# Patient Record
Sex: Male | Born: 1965
Health system: Southern US, Community
[De-identification: ages and names within clinical notes are randomized; demographics above are authoritative.]

## PROBLEM LIST (undated history)

## (undated) DIAGNOSIS — I10 Essential (primary) hypertension: Secondary | ICD-10-CM

## (undated) DIAGNOSIS — E7800 Pure hypercholesterolemia, unspecified: Secondary | ICD-10-CM

## (undated) DIAGNOSIS — N200 Calculus of kidney: Secondary | ICD-10-CM

## (undated) HISTORY — PX: HERNIA REPAIR: SHX51

## (undated) HISTORY — PX: KNEE SURGERY: SHX244

---

## 2013-01-17 ENCOUNTER — Emergency Department: Payer: Self-pay | Admitting: Emergency Medicine

## 2013-01-17 LAB — CBC
HCT: 44.8 % (ref 40.0–52.0)
HGB: 15.7 g/dL (ref 13.0–18.0)
MCV: 91 fL (ref 80–100)
Platelet: 188 10*3/uL (ref 150–440)
RDW: 13 % (ref 11.5–14.5)
WBC: 8.4 10*3/uL (ref 3.8–10.6)

## 2013-01-17 LAB — URINALYSIS, COMPLETE
Bilirubin,UR: NEGATIVE
Hyaline Cast: 1
Leukocyte Esterase: NEGATIVE
Nitrite: NEGATIVE
Ph: 5 (ref 4.5–8.0)
Protein: NEGATIVE
RBC,UR: 73 /HPF (ref 0–5)
Specific Gravity: 1.03 (ref 1.003–1.030)

## 2013-01-17 LAB — BASIC METABOLIC PANEL
Anion Gap: 10 (ref 7–16)
BUN: 20 mg/dL — ABNORMAL HIGH (ref 7–18)
Chloride: 106 mmol/L (ref 98–107)
Co2: 24 mmol/L (ref 21–32)
Creatinine: 1.23 mg/dL (ref 0.60–1.30)
EGFR (Non-African Amer.): >60
Glucose: 167 mg/dL — ABNORMAL HIGH (ref 65–99)
Osmolality: 286 (ref 275–301)
Potassium: 3.9 mmol/L (ref 3.5–5.1)
Sodium: 140 mmol/L (ref 136–145)

## 2013-10-16 ENCOUNTER — Ambulatory Visit: Payer: Self-pay | Admitting: Urology

## 2015-12-23 DIAGNOSIS — E782 Mixed hyperlipidemia: Secondary | ICD-10-CM | POA: Diagnosis not present

## 2015-12-23 DIAGNOSIS — I10 Essential (primary) hypertension: Secondary | ICD-10-CM | POA: Diagnosis not present

## 2015-12-23 DIAGNOSIS — N200 Calculus of kidney: Secondary | ICD-10-CM | POA: Diagnosis not present

## 2015-12-23 DIAGNOSIS — Z6836 Body mass index (BMI) 36.0-36.9, adult: Secondary | ICD-10-CM | POA: Diagnosis not present

## 2016-07-24 DIAGNOSIS — Z0000 Encounter for general adult medical examination without abnormal findings: Secondary | ICD-10-CM | POA: Diagnosis not present

## 2016-07-24 DIAGNOSIS — Z6837 Body mass index (BMI) 37.0-37.9, adult: Secondary | ICD-10-CM | POA: Diagnosis not present

## 2016-07-24 DIAGNOSIS — I10 Essential (primary) hypertension: Secondary | ICD-10-CM | POA: Diagnosis not present

## 2016-07-24 DIAGNOSIS — E782 Mixed hyperlipidemia: Secondary | ICD-10-CM | POA: Diagnosis not present

## 2016-08-15 ENCOUNTER — Emergency Department
Admission: EM | Admit: 2016-08-15 | Discharge: 2016-08-15 | Disposition: A | Discharge status: Home / Self Care | Payer: BLUE CROSS/BLUE SHIELD | Attending: Student in an Organized Health Care Education/Training Program | Admitting: Student in an Organized Health Care Education/Training Program

## 2016-08-15 ENCOUNTER — Emergency Department: Payer: BLUE CROSS/BLUE SHIELD

## 2016-08-15 ENCOUNTER — Encounter: Payer: Self-pay | Admitting: Emergency Medicine

## 2016-08-15 DIAGNOSIS — I10 Essential (primary) hypertension: Secondary | ICD-10-CM | POA: Insufficient documentation

## 2016-08-15 DIAGNOSIS — M25562 Pain in left knee: Secondary | ICD-10-CM

## 2016-08-15 DIAGNOSIS — M25462 Effusion, left knee: Secondary | ICD-10-CM

## 2016-08-15 HISTORY — DX: Calculus of kidney: N20.0

## 2016-08-15 HISTORY — DX: Pure hypercholesterolemia, unspecified: E78.00

## 2016-08-15 HISTORY — DX: Essential (primary) hypertension: I10

## 2016-08-15 LAB — SYNOVIAL CELL COUNT + DIFF, W/ CRYSTALS
CRYSTALS FLUID: NONE SEEN
Eosinophils-Synovial: 0 %
LYMPHOCYTES-SYNOVIAL FLD: 26 %
MONOCYTE-MACROPHAGE-SYNOVIAL FLUID: 73 %
Neutrophil, Synovial: 1 %
WBC, Synovial: 889 /mm3 — ABNORMAL HIGH (ref 0–200)

## 2016-08-15 MED ORDER — LIDOCAINE HCL (PF) 1 % IJ SOLN
30.0000 mL | Freq: Once | INTRAMUSCULAR | Status: AC
  Administered 2016-08-15: 30 mL via INTRADERMAL
  Filled 2016-08-15: qty 30

## 2016-08-15 MED ORDER — HYDROCODONE-ACETAMINOPHEN 5-325 MG PO TABS: 1.0000 | ORAL_TABLET | ORAL | 0 refills | Status: DC | PRN

## 2016-08-15 MED ORDER — NAPROXEN 500 MG PO TABS: 500.0000 mg | ORAL_TABLET | Freq: Two times a day (BID) | ORAL | 0 refills | Status: AC

## 2016-08-15 NOTE — ED Notes (Signed)
Ace wrap applied to left knee. Pt tolerated well

## 2016-08-15 NOTE — ED Notes (Signed)
Pt to ed with c/o left knee pain and swelling x 3 weeks.  Pt denies injury.  No redness noted to knee.  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  Good ROM but with increased pain.  Pt alert and oriented.  Family at bedside.

## 2016-08-15 NOTE — ED Provider Notes (Signed)
[facility] Emergency Department Provider Note    None    (approximate)  I have reviewed the triage vital signs and the nursing notes.   HISTORY  Chief Complaint Knee Injury    HPI Melvin Murray is a 51 y.o. male this visit 3 weeks of progressively worsening left knee pain and swelling. Denies any focal injury the patient works in [employer] and states that he is on his knees using kneepads were working with concrete all day. States that over the past several days the range of motion has decreased due to worsening swelling. He denies any fevers. Does not know any history of gout. No tick bites. No previous history of arthritides.  He is not on any blood thinners.   Past Medical History:  Diagnosis Date  . Hypercholesterolemia   . Hypertension   . Kidney stones    No family history on file. Past Surgical History:  Procedure Laterality Date  . HERNIA REPAIR    . KNEE SURGERY     right   There are no active problems to display for this patient.     Prior to Admission medications   Not on File    Allergies Patient has no known allergies.    Social History Social History  Substance Use Topics  . Smoking status: Never Smoker  . Smokeless tobacco: Never Used  . Alcohol use Not on file    Review of Systems Patient denies headaches, rhinorrhea, blurry vision, numbness, shortness of breath, chest pain, edema, cough, abdominal pain, nausea, vomiting, diarrhea, dysuria, fevers, rashes or hallucinations unless otherwise stated above in HPI. ____________________________________________   PHYSICAL EXAM:  VITAL SIGNS: Vitals:   08/15/16 0906  BP: 128/66  Pulse: 62  Resp: 16  Temp: 98 F (36.7 C)    Constitutional: Alert and oriented. Well appearing and in no acute distress. Eyes: Conjunctivae are normal. PERRL. EOMI. Head: Atraumatic. Nose: No congestion/rhinnorhea. Mouth/Throat: Mucous membranes are moist.  Oropharynx  non-erythematous. Neck: No stridor. Painless ROM. No cervical spine tenderness to palpation Hematological/Lymphatic/Immunilogical: No cervical lymphadenopathy. Cardiovascular: Normal rate, regular rhythm. Grossly normal heart sounds.  Good peripheral circulation. Respiratory: Normal respiratory effort.  No retractions. Lungs CTAB. Gastrointestinal: Soft and nontender. No distention. No abdominal bruits. No CVA tenderness. Genitourinary:  Musculoskeletal: Left knee does have a large tense effusion limiting range of motion. He does have painless passive and active range of motion of roughly 30. There is no significant crepitus. The patella does have some bogginess to it. There is no overlying erythema. Neurologic:  Normal speech and language. No gross focal neurologic deficits are appreciated. No gait instability. Skin:  Skin is warm, dry and intact. No rash noted. Psychiatric: Mood and affect are normal. Speech and behavior are normal.  ____________________________________________   LABS (all labs ordered are listed, but only abnormal results are displayed)  No results found for this or any previous visit (from the past 24 hour(s)). ____________________________________________  EKG ____________________________________________  RADIOLOGY  I personally reviewed all radiographic images ordered to evaluate for the above acute complaints and reviewed radiology reports and findings.  These findings were personally discussed with the patient.  Please see medical record for radiology report.  ____________________________________________   PROCEDURES  Procedure(s) performed:  .Joint Aspiration/Arthrocentesis Date/Time: 08/15/2016 10:13 AM Performed by: Merlyn Lot Authorized by: Merlyn Lot   Consent:    Consent obtained:  Verbal   Consent given by:  Patient   Risks discussed:  Bleeding, incomplete drainage, infection, nerve damage,  pain and poor cosmetic result    Alternatives discussed:  No treatment Location:    Location:  Knee   Knee:  L knee Anesthesia (see MAR for exact dosages):    Anesthesia method:  Local infiltration   Local anesthetic:  Lidocaine 1% w/o epi Procedure details:    Preparation: Patient was prepped and draped in usual sterile fashion     Needle gauge:  18 G   Approach:  Lateral   Aspirate amount:  30   Aspirate characteristics:  Serous   Steroid injected: no     Specimen collected: yes   Post-procedure details:    Dressing:  Adhesive bandage and gauze roll   Patient tolerance of procedure:  Tolerated well, no immediate complications      Critical Care performed: no ____________________________________________   INITIAL IMPRESSION / ASSESSMENT AND PLAN / ED COURSE  Pertinent labs & imaging results that were available during my care of the patient were reviewed by me and considered in my medical decision making (see chart for details).  DDX: Gout, septic arthritis, traumatic arthritis, fracture, inflammatory arthritis, bursitis  Melvin Murray is a 51 y.o. who presents to the ED with left knee pain and swelling. Denies any other injuries. Denies motor or sensory loss. Able to bear weight. VSS in ED. Exam as above. NV intact throughout and distal to injury. Pt able to range joint but with limited ROM due to tense effusion. No ligament laxity on exam. No clinical suspicion for infectious process or septic joint. X-rays w/o fracture. No other injuries reported or noted on exam.  Based on tense effusion I do plan on per warming arthrocentesis to further evaluate.   Clinical Course as of Aug 15 1257  Sat Aug 15, 2016  1054 Went up date patient. Laboratories also pending. Patient requesting discharge home. As there is a very low probability the patient having an acute infected knee and feel his presentation more consistent with inflammatory arthritis I do feel is reasonable for patient to be discharged. He has provided  reliable contact information. I will follow-up on lab test and will call him back should any other be abnormal. Otherwise if there is no evidence of infectious or gout process we'll continue with conservative management with follow-up with orthopedics. Patient was able to tolerate PO and was able to ambulate with a steady gait.  Have discussed with the patient and available family all diagnostics and treatments performed thus far and all questions were answered to the best of my ability. The patient demonstrates understanding and agreement with plan.    [PR]    Clinical Course User Index [PR] Merlyn Lot, MD     ____________________________________________   FINAL CLINICAL IMPRESSION(S) / ED DIAGNOSES  Final diagnoses:  Acute pain of left knee  Effusion of left knee      NEW MEDICATIONS STARTED DURING THIS VISIT:  New Prescriptions   No medications on file     Note:  This document was prepared using Dragon voice recognition software and may include unintentional dictation errors.    Merlyn Lot, MD 08/15/16 1300

## 2016-08-15 NOTE — ED Triage Notes (Signed)
Reports his left knee gave out about 3 weeks ago.  Pain since.

## 2016-08-17 LAB — MISC LABCORP TEST (SEND OUT)
LABCORP TEST NAME: 19497
LABCORP TEST NAME: 19588
Labcorp test code: 19497
Labcorp test code: 19588

## 2016-08-17 LAB — GRAM STAIN

## 2016-08-20 LAB — CULTURE, BODY FLUID W GRAM STAIN -BOTTLE: Culture: NO GROWTH

## 2016-08-20 LAB — CULTURE, BODY FLUID-BOTTLE

## 2016-08-26 DIAGNOSIS — M25462 Effusion, left knee: Secondary | ICD-10-CM | POA: Diagnosis not present

## 2016-08-26 DIAGNOSIS — M2392 Unspecified internal derangement of left knee: Secondary | ICD-10-CM | POA: Diagnosis not present

## 2016-09-16 DIAGNOSIS — M2392 Unspecified internal derangement of left knee: Secondary | ICD-10-CM | POA: Diagnosis not present

## 2017-01-28 DIAGNOSIS — E669 Obesity, unspecified: Secondary | ICD-10-CM | POA: Diagnosis not present

## 2017-01-28 DIAGNOSIS — Z1389 Encounter for screening for other disorder: Secondary | ICD-10-CM | POA: Diagnosis not present

## 2017-01-28 DIAGNOSIS — I10 Essential (primary) hypertension: Secondary | ICD-10-CM | POA: Diagnosis not present

## 2017-01-28 DIAGNOSIS — R7303 Prediabetes: Secondary | ICD-10-CM | POA: Diagnosis not present

## 2017-01-28 DIAGNOSIS — E782 Mixed hyperlipidemia: Secondary | ICD-10-CM | POA: Diagnosis not present

## 2017-01-28 DIAGNOSIS — N200 Calculus of kidney: Secondary | ICD-10-CM | POA: Diagnosis not present

## 2017-08-02 DIAGNOSIS — I10 Essential (primary) hypertension: Secondary | ICD-10-CM | POA: Diagnosis not present

## 2017-08-02 DIAGNOSIS — R7303 Prediabetes: Secondary | ICD-10-CM | POA: Diagnosis not present

## 2017-08-02 DIAGNOSIS — Z0000 Encounter for general adult medical examination without abnormal findings: Secondary | ICD-10-CM | POA: Diagnosis not present

## 2017-08-02 DIAGNOSIS — E782 Mixed hyperlipidemia: Secondary | ICD-10-CM | POA: Diagnosis not present

## 2017-08-02 DIAGNOSIS — R1031 Right lower quadrant pain: Secondary | ICD-10-CM | POA: Diagnosis not present

## 2017-08-08 IMAGING — DX DG KNEE 1-2V*L*
2 series · 2 of 2 positions shown · non-contrast
Comparison: None.

CLINICAL DATA: Pt states he has had pain and swelling to LT knee x
2-3 weeks with no known injury; pain with weight-bearing; no
previous hx per pt.

EXAM:
LEFT KNEE - 1-2 VIEW

[knee ap]
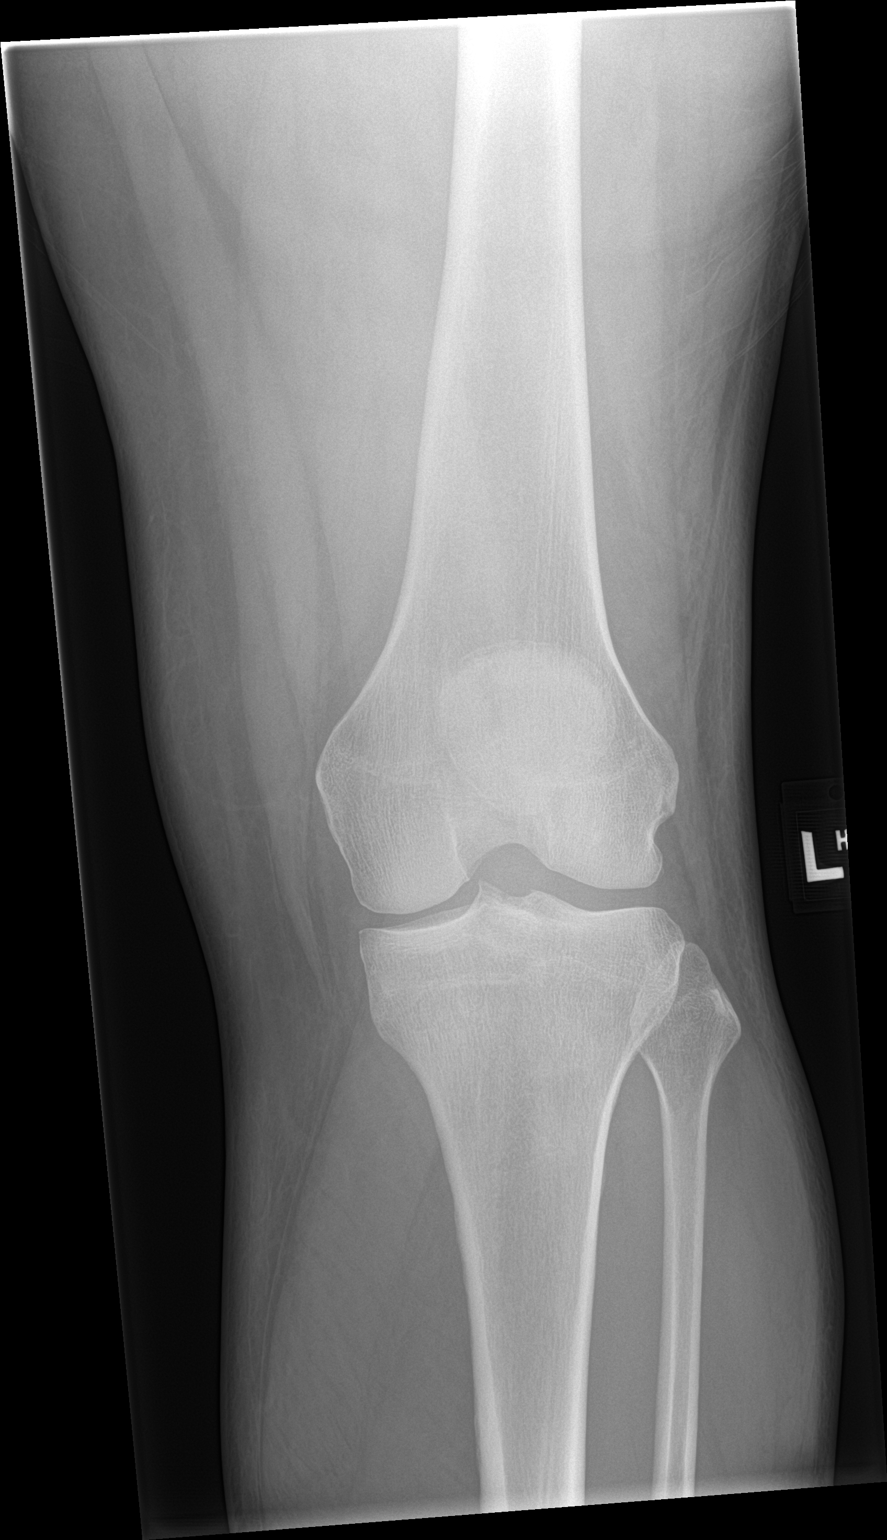

[knee lat]
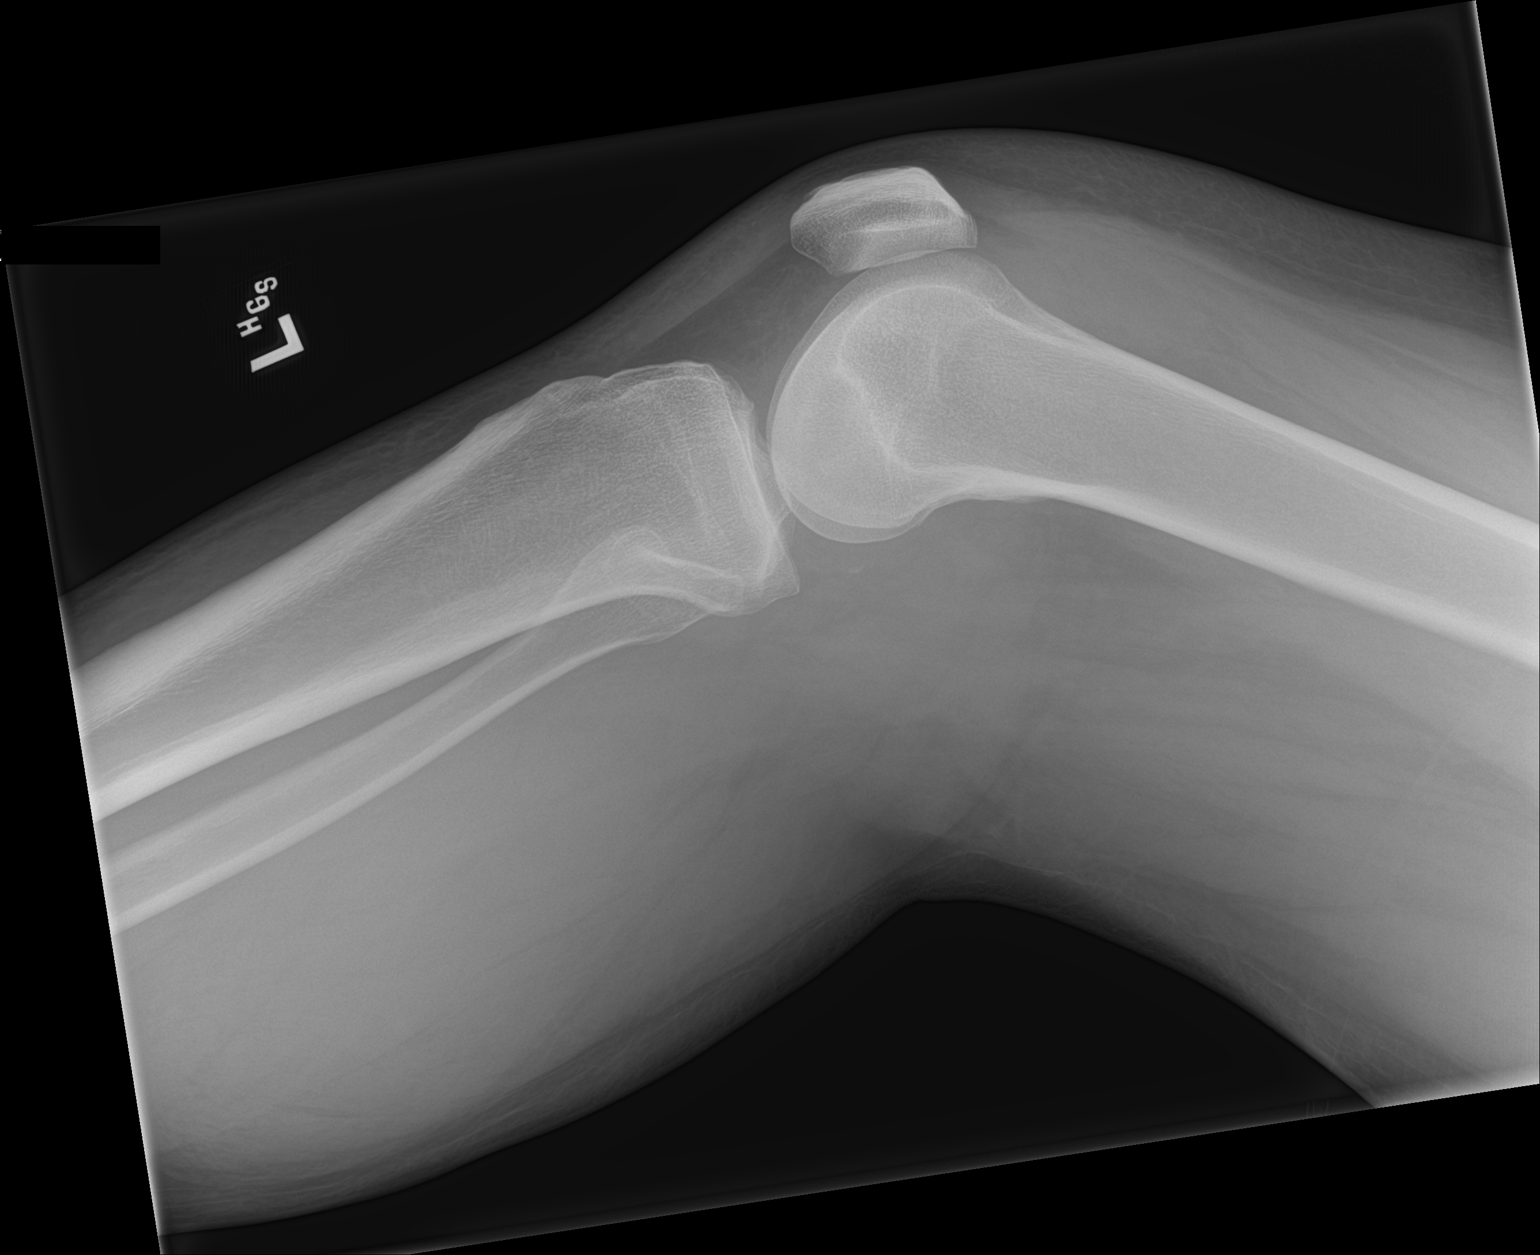

[2 of 2 positions shown; findings below may reference images not displayed]

FINDINGS: No fracture.  No bone lesion.

Knee joint is normally spaced and aligned. No significant
degenerative/ arthropathic changes.

There is a small to moderate joint effusion distending the
suprapatellar joint capsule.

Soft tissues are otherwise unremarkable.
IMPRESSION: 1. No fracture, bone lesion or arthropathic changes.
2. Small to moderate joint effusion.

## 2017-08-12 ENCOUNTER — Encounter: Payer: Self-pay | Admitting: *Deleted

## 2017-08-18 ENCOUNTER — Encounter: Payer: Self-pay | Admitting: *Deleted

## 2017-08-19 ENCOUNTER — Ambulatory Visit: Payer: Self-pay | Admitting: General Surgery

## 2017-09-09 ENCOUNTER — Encounter: Payer: Self-pay | Admitting: General Surgery

## 2017-09-09 ENCOUNTER — Ambulatory Visit: Payer: BLUE CROSS/BLUE SHIELD | Admitting: General Surgery

## 2017-09-09 VITALS — BP 130/70 | HR 71 | Resp 12 | Ht 67.0 in | Wt 254.0 lb

## 2017-09-09 DIAGNOSIS — Z1211 Encounter for screening for malignant neoplasm of colon: Secondary | ICD-10-CM | POA: Diagnosis not present

## 2017-09-09 DIAGNOSIS — K5901 Slow transit constipation: Secondary | ICD-10-CM | POA: Diagnosis not present

## 2017-09-09 MED ORDER — POLYETHYLENE GLYCOL 3350 17 GM/SCOOP PO POWD: 1.0000 | Freq: Once | ORAL | 0 refills | Status: AC

## 2017-09-09 MED ORDER — BISACODYL 5 MG PO TBEC: 5.0000 mg | DELAYED_RELEASE_TABLET | ORAL | 0 refills | Status: AC | PRN

## 2017-09-09 NOTE — Patient Instructions (Addendum)
2 day colonoscopy prep  Colonoscopy, Adult A colonoscopy is an exam to look at the entire large intestine. During the exam, a lubricated, bendable tube is inserted into the anus and then passed into the rectum, colon, and other parts of the large intestine. A colonoscopy is often done as a part of normal colorectal screening or in response to certain symptoms, such as anemia, persistent diarrhea, abdominal pain, and blood in the stool. The exam can help screen for and diagnose medical problems, including:  Tumors.  Polyps.  Inflammation.  Areas of bleeding.  Tell a health care provider about:  Any allergies you have.  All medicines you are taking, including vitamins, herbs, eye drops, creams, and over-the-counter medicines.  Any problems you or family members have had with anesthetic medicines.  Any blood disorders you have.  Any surgeries you have had.  Any medical conditions you have.  Any problems you have had passing stool. What are the risks? Generally, this is a safe procedure. However, problems may occur, including:  Bleeding.  A tear in the intestine.  A reaction to medicines given during the exam.  Infection (rare).  What happens before the procedure? Eating and drinking restrictions Follow instructions from your health care provider about eating and drinking, which may include:  A few days before the procedure - follow a low-fiber diet. Avoid nuts, seeds, dried fruit, raw fruits, and vegetables.  1-3 days before the procedure - follow a clear liquid diet. Drink only clear liquids, such as clear broth or bouillon, black coffee or tea, clear juice, clear soft drinks or sports drinks, gelatin dessert, and popsicles. Avoid any liquids that contain red or purple dye.  On the day of the procedure - do not eat or drink anything during the 2 hours before the procedure, or within the time period that your health care provider recommends.  Bowel prep If you were  prescribed an oral bowel prep to clean out your colon:  Take it as told by your health care provider. Starting the day before your procedure, you will need to drink a large amount of medicated liquid. The liquid will cause you to have multiple loose stools until your stool is almost clear or light green.  If your skin or anus gets irritated from diarrhea, you may use these to relieve the irritation: ? Medicated wipes, such as adult wet wipes with aloe and vitamin E. ? A skin soothing-product like petroleum jelly.  If you vomit while drinking the bowel prep, take a break for up to 60 minutes and then begin the bowel prep again. If vomiting continues and you cannot take the bowel prep without vomiting, call your health care provider.  General instructions  Ask your health care provider about changing or stopping your regular medicines. This is especially important if you are taking diabetes medicines or blood thinners.  Plan to have someone take you home from the hospital or clinic. What happens during the procedure?  An IV tube may be inserted into one of your veins.  You will be given medicine to help you relax (sedative).  To reduce your risk of infection: ? Your health care team will wash or sanitize their hands. ? Your anal area will be washed with soap.  You will be asked to lie on your side with your knees bent.  Your health care provider will lubricate a long, thin, flexible tube. The tube will have a camera and a light on the end.  The tube  will be inserted into your anus.  The tube will be gently eased through your rectum and colon.  Air will be delivered into your colon to keep it open. You may feel some pressure or cramping.  The camera will be used to take images during the procedure.  A small tissue sample may be removed from your body to be examined under a microscope (biopsy). If any potential problems are found, the tissue will be sent to a lab for testing.  If  small polyps are found, your health care provider may remove them and have them checked for cancer cells.  The tube that was inserted into your anus will be slowly removed. The procedure may vary among health care providers and hospitals. What happens after the procedure?  Your blood pressure, heart rate, breathing rate, and blood oxygen level will be monitored until the medicines you were given have worn off.  Do not drive for 24 hours after the exam.  You may have a small amount of blood in your stool.  You may pass gas and have mild abdominal cramping or bloating due to the air that was used to inflate your colon during the exam.  It is up to you to get the results of your procedure. Ask your health care provider, or the department performing the procedure, when your results will be ready. This information is not intended to replace advice given to you by your health care provider. Make sure you discuss any questions you have with your health care provider. Document Released: 07/03/2000 Document Revised: 05/06/2016 Document Reviewed: 09/17/2015 Elsevier Interactive Patient Education  [employer].  The patient is scheduled for a Colonoscopy at [facility] on 10/06/17. They are aware to call the day before to get their arrival time. He will complete a two day clear liquid prep. He will take 2-5 mg Dulcolax tablets the morning and evening two days prior and 2-5 mg Dulcolax tablets the morning prior to procedure. He will decrease his Aspirin to 81 mg one week prior and stop his Fish Oil one week prior. Miralax prescription has been sent into the patient's pharmacy. The patient is aware of date and instructions.

## 2017-09-09 NOTE — Progress Notes (Addendum)
Patient ID: Melvin Murray, male   DOB: 1965/11/28, 52 y.o.   MRN: [id number]  Chief Complaint  Patient presents with  . Colonoscopy    HPI Melvin Murray is a 52 y.o. male.  Who presents for a colonoscopy discussion, none prior. Denies any gastrointestinal issues. Bowels move every 3 days, occasional pellets but no bleeding noted. The patient drinks a good amount of free water, but works in the [employer] which is quite hot during the summer months.  He is here with his wife, Melvin Murray of 18 years.  HPI  Past Medical History:  Diagnosis Date  . Hypercholesterolemia   . Hypertension   . Kidney stones     Past Surgical History:  Procedure Laterality Date  . HERNIA REPAIR    . KNEE SURGERY     right    Family History  Problem Relation Age of Onset  . Colon cancer Neg Hx     Social History Social History   Tobacco Use  . Smoking status: Never Smoker  . Smokeless tobacco: Never Used  Substance Use Topics  . Alcohol use: No    Frequency: Never  . Drug use: No    No Known Allergies  Current Outpatient Medications  Medication Sig Dispense Refill  . aspirin EC 325 MG tablet Take 325 mg by mouth daily.     Marland Kitchen lisinopril (PRINIVIL,ZESTRIL) 20 MG tablet Take 20 mg by mouth daily.     . Omega-3 Fatty Acids (FISH OIL) 1000 MG CAPS Take by mouth daily.    . potassium citrate (UROCIT-K) 10 MEQ (1080 MG) SR tablet Take 10 mEq by mouth 2 (two) times daily.     . pravastatin (PRAVACHOL) 40 MG tablet Take 40 mg by mouth daily.     . bisacodyl (DULCOLAX) 5 MG EC tablet Take 1 tablet (5 mg total) by mouth as needed for moderate constipation. Take 2 tablets the morning and evening 2 days prior to your colonoscopy Take 2 tablets the afternoon prior to your colonoscopy 6 tablet 0  . polyethylene glycol powder (GLYCOLAX/MIRALAX) powder Take 255 g by mouth once for 1 dose. Mix whole container with 64 ounces of clear liquids 255 g 0   No current facility-administered  medications for this visit.     Review of Systems Review of Systems  Constitutional: Negative.   Respiratory: Negative.   Cardiovascular: Negative.   Gastrointestinal: Positive for constipation. Negative for blood in stool and diarrhea.    Blood pressure 130/70, pulse 71, resp. rate 12, height 5\' 7"  (1.702 m), weight 254 lb (115.2 kg).  Physical Exam Physical Exam  Constitutional: He is oriented to person, place, and time. He appears well-developed and well-nourished.  HENT:  Mouth/Throat: Oropharynx is clear and moist.  Eyes: Conjunctivae are normal. No scleral icterus.  Neck: Neck supple.  Cardiovascular: Normal rate and regular rhythm.  Pulmonary/Chest: Effort normal and breath sounds normal.  Lymphadenopathy:    He has no cervical adenopathy.  Neurological: He is alert and oriented to person, place, and time.  Skin: Skin is warm and dry.  Psychiatric: His behavior is normal.    Data Reviewed Laboratory studies associated with the August 02, 2017 office note from [place], [place] show a creatinine of 0.9, normal electrolytes, normal liver function studies, hemoglobin 16.4 with an MCV of 91, platelet count of 181,000, hemoglobin A1c: 5.8.  Assessment    Candidate for screening colonoscopy.    Plan    Colonoscopy with possible biopsy/polypectomy prn: Information  regarding the procedure, including its potential risks and complications (including but not limited to perforation of the bowel, which may require emergency surgery to repair, and bleeding) was verbally given to the patient. Educational information regarding lower intestinal endoscopy was given to the patient. Written instructions for how to complete the bowel prep using Miralax were provided. The importance of drinking ample fluids to avoid dehydration as a result of the prep emphasized.  2 day colonoscopy prep with Dulcolax will be planned based on his history of infrequent bowel movements every 3+ days and hard  stools.    HPI, Physical Exam, Assessment and Plan have been scribed under the direction and in the presence of Robert Bellow, MD. Karie Fetch, RN  The patient is scheduled for a Colonoscopy at [facility] on 10/06/17. They are aware to call the day before to get their arrival time. He will complete a two day clear liquid prep. He will take 2-5 mg Dulcolax tablets the morning and evening two days prior and 2-5 mg Dulcolax tablets the morning prior to procedure. He will decrease his Aspirin to 81 mg one week prior and stop his Fish Oil one week prior. Miralax prescription has been sent into the patient's pharmacy. The patient is aware of date and instructions.  Documented by Caryl-Lyn Otis Brace LPN   Melvin Murray 09/09/2017, 9:15 PM

## 2017-09-13 NOTE — Addendum Note (Signed)
Addended by: Robert Bellow on: 09/13/2017 09:25 PM   Modules accepted: Orders, SmartSet

## 2017-10-01 DIAGNOSIS — R1084 Generalized abdominal pain: Secondary | ICD-10-CM | POA: Diagnosis not present

## 2017-10-01 DIAGNOSIS — K219 Gastro-esophageal reflux disease without esophagitis: Secondary | ICD-10-CM | POA: Diagnosis not present

## 2017-10-01 DIAGNOSIS — R143 Flatulence: Secondary | ICD-10-CM | POA: Diagnosis not present

## 2017-10-05 ENCOUNTER — Encounter: Payer: Self-pay | Admitting: Emergency Medicine

## 2017-10-06 ENCOUNTER — Encounter
Admission: RE | Disposition: A | Discharge status: Home / Self Care | Payer: Self-pay | Source: Ambulatory Visit | Attending: General Surgery

## 2017-10-06 ENCOUNTER — Ambulatory Visit: Payer: BLUE CROSS/BLUE SHIELD | Admitting: Certified Registered Nurse Anesthetist

## 2017-10-06 ENCOUNTER — Ambulatory Visit
Admission: RE | Admit: 2017-10-06 | Discharge: 2017-10-06 | Disposition: A | Discharge status: Home / Self Care | Payer: BLUE CROSS/BLUE SHIELD | Source: Ambulatory Visit | Attending: General Surgery | Admitting: General Surgery

## 2017-10-06 ENCOUNTER — Encounter: Payer: Self-pay | Admitting: *Deleted

## 2017-10-06 DIAGNOSIS — Z7982 Long term (current) use of aspirin: Secondary | ICD-10-CM | POA: Diagnosis not present

## 2017-10-06 DIAGNOSIS — Z1211 Encounter for screening for malignant neoplasm of colon: Secondary | ICD-10-CM | POA: Diagnosis not present

## 2017-10-06 DIAGNOSIS — Z79899 Other long term (current) drug therapy: Secondary | ICD-10-CM | POA: Diagnosis not present

## 2017-10-06 DIAGNOSIS — K635 Polyp of colon: Secondary | ICD-10-CM | POA: Diagnosis not present

## 2017-10-06 DIAGNOSIS — E7800 Pure hypercholesterolemia, unspecified: Secondary | ICD-10-CM | POA: Diagnosis not present

## 2017-10-06 DIAGNOSIS — D123 Benign neoplasm of transverse colon: Secondary | ICD-10-CM | POA: Diagnosis not present

## 2017-10-06 DIAGNOSIS — K5900 Constipation, unspecified: Secondary | ICD-10-CM | POA: Insufficient documentation

## 2017-10-06 DIAGNOSIS — I10 Essential (primary) hypertension: Secondary | ICD-10-CM | POA: Insufficient documentation

## 2017-10-06 HISTORY — PX: COLONOSCOPY WITH PROPOFOL: SHX5780

## 2017-10-06 SURGERY — COLONOSCOPY WITH PROPOFOL
Anesthesia: General

## 2017-10-06 MED ORDER — SODIUM CHLORIDE 0.9 % IV SOLN
INTRAVENOUS | Status: DC
  Administered 2017-10-06: 1000 mL via INTRAVENOUS

## 2017-10-06 MED ORDER — PROPOFOL 10 MG/ML IV BOLUS
INTRAVENOUS | Status: DC | PRN
  Administered 2017-10-06: 80 mg via INTRAVENOUS

## 2017-10-06 MED ORDER — LIDOCAINE HCL (PF) 2 % IJ SOLN
INTRAMUSCULAR | Status: AC
  Filled 2017-10-06: qty 10

## 2017-10-06 MED ORDER — PROPOFOL 500 MG/50ML IV EMUL
INTRAVENOUS | Status: AC
  Filled 2017-10-06: qty 200

## 2017-10-06 MED ORDER — PROPOFOL 500 MG/50ML IV EMUL
INTRAVENOUS | Status: DC | PRN
  Administered 2017-10-06: 150 ug/kg/min via INTRAVENOUS

## 2017-10-06 MED ORDER — PROPOFOL 10 MG/ML IV BOLUS
INTRAVENOUS | Status: AC
  Filled 2017-10-06: qty 20

## 2017-10-06 NOTE — Anesthesia Preprocedure Evaluation (Signed)
Anesthesia Evaluation  Patient identified by MRN, date of birth, ID band Patient awake    Reviewed: Allergy & Precautions, NPO status , Patient's Chart, lab work & pertinent test results  History of Anesthesia Complications Negative for: history of anesthetic complications  Airway Mallampati: III  TM Distance: >3 FB Neck ROM: Full    Dental no notable dental hx.    Pulmonary neg pulmonary ROS, neg sleep apnea, neg COPD,    breath sounds clear to auscultation- rhonchi (-) wheezing      Cardiovascular Exercise Tolerance: Good hypertension, Pt. on medications (-) CAD, (-) Past MI, (-) Cardiac Stents and (-) CABG  Rhythm:Regular Rate:Normal - Systolic murmurs and - Diastolic murmurs    Neuro/Psych negative neurological ROS  negative psych ROS   GI/Hepatic negative GI ROS, Neg liver ROS,   Endo/Other  negative endocrine ROSneg diabetes  Renal/GU Renal disease: hx of nephrolithiasis.     Musculoskeletal negative musculoskeletal ROS (+)   Abdominal (+) + obese,   Peds  Hematology negative hematology ROS (+)   Anesthesia Other Findings Past Medical History: No date: Hypercholesterolemia No date: Hypertension No date: Kidney stones   Reproductive/Obstetrics                             Anesthesia Physical Anesthesia Plan  ASA: II  Anesthesia Plan: General   Post-op Pain Management:    Induction: Intravenous  PONV Risk Score and Plan: 1 and Propofol infusion  Airway Management Planned: Natural Airway  Additional Equipment:   Intra-op Plan:   Post-operative Plan:   Informed Consent: I have reviewed the patients History and Physical, chart, labs and discussed the procedure including the risks, benefits and alternatives for the proposed anesthesia with the patient or authorized representative who has indicated his/her understanding and acceptance.   Dental advisory given  Plan  Discussed with: CRNA and Anesthesiologist  Anesthesia Plan Comments:         Anesthesia Quick Evaluation

## 2017-10-06 NOTE — H&P (Signed)
No change in clinical history or exam.  Tolerated prep well.   For screening colonoscopy.  Lungs: Clear. Cardio: RR.

## 2017-10-06 NOTE — Transfer of Care (Signed)
Immediate Anesthesia Transfer of Care Note  Patient: Melvin Murray  Procedure(s) Performed: COLONOSCOPY WITH PROPOFOL (N/A )  Patient Location: PACU  Anesthesia Type:General  Level of Consciousness: sedated  Airway & Oxygen Therapy: Patient Spontanous Breathing and Patient connected to nasal cannula oxygen  Post-op Assessment: Report given to RN and Post -op Vital signs reviewed and stable  Post vital signs: Reviewed and stable  Last Vitals:  Vitals:   10/06/17 0713 10/06/17 0805  BP: (!) 147/93 96/69  Pulse: 65 (!) 59  Resp: 18 16  Temp: (!) 36.1 C (!) 35.9 C  SpO2: 98% 97%    Last Pain:  Vitals:   10/06/17 0805  TempSrc: Tympanic         Complications: No apparent anesthesia complications

## 2017-10-06 NOTE — Op Note (Signed)
[facility] Gastroenterology Patient Name: Melvin Murray Procedure Date: 10/06/2017 7:36 AM MRN: [id number] Account #: [id number] Date of Birth: 1966/06/07 Admit Type: Outpatient Age: 52 Room: [facility] ENDO ROOM 1 Gender: Male Note Status: Finalized Procedure:            Colonoscopy Indications:          Screening for colorectal malignant neoplasm Providers:            Robert Bellow, MD Referring MD:         Cyndi Bender (Referring MD) Medicines:            Monitored Anesthesia Care Complications:        No immediate complications. Procedure:            Pre-Anesthesia Assessment:                       - Prior to the procedure, a History and Physical was                        performed, and patient medications, allergies and                        sensitivities were reviewed. The patient's tolerance of                        previous anesthesia was reviewed.                       - The risks and benefits of the procedure and the                        sedation options and risks were discussed with the                        patient. All questions were answered and informed                        consent was obtained.                       After obtaining informed consent, the colonoscope was                        passed under direct vision. Throughout the procedure,                        the patient's blood pressure, pulse, and oxygen                        saturations were monitored continuously. The                        Colonoscope was introduced through the anus and                        advanced to the the cecum, identified by appendiceal                        orifice and ileocecal valve. The colonoscopy was  performed without difficulty. The patient tolerated the                        procedure well. The quality of the bowel preparation                        was excellent. Findings:      A 5 mm polyp was found in the transverse  colon. The polyp was sessile.       This was biopsied with a cold forceps for histology.      The retroflexed view of the distal rectum and anal verge was normal and       showed no anal or rectal abnormalities. Impression:           - One 5 mm polyp in the transverse colon. Biopsied.                       - The distal rectum and anal verge are normal on                        retroflexion view. Recommendation:       - Telephone endoscopist for pathology results in 1 week.                       - Miralax 1 capful (17 grams) in 8 ounces of water PO                        daily indefinitely. Procedure Code(s):    --- Professional ---                       [phone], Colonoscopy, flexible; with biopsy, single or                        multiple Diagnosis Code(s):    --- Professional ---                       D12.3, Benign neoplasm of transverse colon (hepatic                        flexure or splenic flexure)                       Z12.11, Encounter for screening for malignant neoplasm                        of colon CPT copyright 2016 American [employer] Association. All rights reserved. The codes documented in this report are preliminary and upon coder review may  be revised to meet current compliance requirements. Robert Bellow, MD 10/06/2017 8:04:55 AM This report has been signed electronically. Number of Addenda: 0 Note Initiated On: 10/06/2017 7:36 AM Scope Withdrawal Time: 0 hours 6 minutes 7 seconds  Total Procedure Duration: 0 hours 19 minutes 59 seconds       [facility]

## 2017-10-06 NOTE — Anesthesia Procedure Notes (Signed)
Performed by: Demetrius Charity, CRNA Pre-anesthesia Checklist: Patient identified, Emergency Drugs available, Suction available and Timeout performed Patient Re-evaluated:Patient Re-evaluated prior to induction Oxygen Delivery Method: Nasal cannula Induction Type: IV induction

## 2017-10-06 NOTE — Anesthesia Post-op Follow-up Note (Signed)
Anesthesia QCDR form completed.        

## 2017-10-06 NOTE — Anesthesia Postprocedure Evaluation (Signed)
Anesthesia Post Note  Patient: Melvin Murray  Procedure(s) Performed: COLONOSCOPY WITH PROPOFOL (N/A )  Patient location during evaluation: Endoscopy Anesthesia Type: General Level of consciousness: awake and alert and oriented Pain management: pain level controlled Vital Signs Assessment: post-procedure vital signs reviewed and stable Respiratory status: spontaneous breathing, nonlabored ventilation and respiratory function stable Cardiovascular status: blood pressure returned to baseline and stable Postop Assessment: no signs of nausea or vomiting Anesthetic complications: no     Last Vitals:  Vitals:   10/06/17 0825 10/06/17 0835  BP: 121/72 130/78  Pulse: (!) 56 62  Resp: 10   Temp:    SpO2: 99%     Last Pain:  Vitals:   10/06/17 0805  TempSrc: Tympanic                 Yarisbel Miranda

## 2017-10-07 LAB — SURGICAL PATHOLOGY

## 2017-10-08 ENCOUNTER — Encounter: Payer: Self-pay | Admitting: General Surgery

## 2018-02-11 DIAGNOSIS — E782 Mixed hyperlipidemia: Secondary | ICD-10-CM | POA: Diagnosis not present

## 2018-02-11 DIAGNOSIS — Z6837 Body mass index (BMI) 37.0-37.9, adult: Secondary | ICD-10-CM | POA: Diagnosis not present

## 2018-02-11 DIAGNOSIS — I10 Essential (primary) hypertension: Secondary | ICD-10-CM | POA: Diagnosis not present

## 2018-02-11 DIAGNOSIS — R7303 Prediabetes: Secondary | ICD-10-CM | POA: Diagnosis not present

## 2018-08-19 DIAGNOSIS — E782 Mixed hyperlipidemia: Secondary | ICD-10-CM | POA: Diagnosis not present

## 2018-08-19 DIAGNOSIS — R7303 Prediabetes: Secondary | ICD-10-CM | POA: Diagnosis not present

## 2018-08-19 DIAGNOSIS — I10 Essential (primary) hypertension: Secondary | ICD-10-CM | POA: Diagnosis not present

## 2018-08-19 DIAGNOSIS — Z0000 Encounter for general adult medical examination without abnormal findings: Secondary | ICD-10-CM | POA: Diagnosis not present

## 2018-08-19 DIAGNOSIS — Z1331 Encounter for screening for depression: Secondary | ICD-10-CM | POA: Diagnosis not present

## 2018-12-15 DIAGNOSIS — J019 Acute sinusitis, unspecified: Secondary | ICD-10-CM | POA: Diagnosis not present

## 2019-01-30 DIAGNOSIS — I10 Essential (primary) hypertension: Secondary | ICD-10-CM | POA: Diagnosis not present

## 2019-01-30 DIAGNOSIS — R0981 Nasal congestion: Secondary | ICD-10-CM | POA: Diagnosis not present

## 2019-01-30 DIAGNOSIS — R112 Nausea with vomiting, unspecified: Secondary | ICD-10-CM | POA: Diagnosis not present

## 2019-01-30 DIAGNOSIS — R42 Dizziness and giddiness: Secondary | ICD-10-CM | POA: Diagnosis not present

## 2019-03-29 DIAGNOSIS — E782 Mixed hyperlipidemia: Secondary | ICD-10-CM | POA: Diagnosis not present

## 2019-03-29 DIAGNOSIS — Z23 Encounter for immunization: Secondary | ICD-10-CM | POA: Diagnosis not present

## 2019-03-29 DIAGNOSIS — I10 Essential (primary) hypertension: Secondary | ICD-10-CM | POA: Diagnosis not present

## 2019-03-29 DIAGNOSIS — R7303 Prediabetes: Secondary | ICD-10-CM | POA: Diagnosis not present

## 2019-04-19 ENCOUNTER — Emergency Department
Admission: EM | Admit: 2019-04-19 | Discharge: 2019-04-19 | Disposition: A | Discharge status: Home / Self Care | Payer: BC Managed Care – PPO | Attending: Emergency Medicine | Admitting: Emergency Medicine

## 2019-04-19 ENCOUNTER — Encounter: Payer: Self-pay | Admitting: Emergency Medicine

## 2019-04-19 ENCOUNTER — Emergency Department: Payer: BC Managed Care – PPO

## 2019-04-19 ENCOUNTER — Other Ambulatory Visit: Payer: Self-pay

## 2019-04-19 DIAGNOSIS — M25552 Pain in left hip: Secondary | ICD-10-CM | POA: Insufficient documentation

## 2019-04-19 DIAGNOSIS — M1612 Unilateral primary osteoarthritis, left hip: Secondary | ICD-10-CM | POA: Diagnosis not present

## 2019-04-19 DIAGNOSIS — Z79899 Other long term (current) drug therapy: Secondary | ICD-10-CM | POA: Insufficient documentation

## 2019-04-19 DIAGNOSIS — Z7982 Long term (current) use of aspirin: Secondary | ICD-10-CM | POA: Diagnosis not present

## 2019-04-19 DIAGNOSIS — I10 Essential (primary) hypertension: Secondary | ICD-10-CM | POA: Diagnosis not present

## 2019-04-19 DIAGNOSIS — M25551 Pain in right hip: Secondary | ICD-10-CM | POA: Diagnosis not present

## 2019-04-19 MED ORDER — MELOXICAM 15 MG PO TABS: 15.0000 mg | ORAL_TABLET | Freq: Every day | ORAL | 2 refills | Status: AC

## 2019-04-19 NOTE — ED Notes (Signed)
Patient ambulatory to room with steady gait and NAD noted. States he has had pain in bilateral hips "for a while" however this morning pain was noticeably worse. Denies any known injury.

## 2019-04-19 NOTE — Discharge Instructions (Signed)
Follow-up with your primary care provider if any continued problems.  Begin taking meloxicam as needed for pain once a day with food.  You may take Tylenol with this medication if additional pain medication is needed.  You may use ice or heat to your hip as needed for discomfort.  Try one and then the other to see which helps the best.  Wear shoes with good support which also will help with your hip pain.

## 2019-04-19 NOTE — ED Triage Notes (Signed)
Patient to ER for c/o bilateral hip pain that he reports having for some time now, but states pain was much more severe upon waking this am. Patient states he took IBU 800mg  before coming to ER and pain has improved. Patient states he had difficulty getting into his truck this am due to pain.

## 2019-04-19 NOTE — ED Provider Notes (Signed)
[facility] Emergency Department Provider Note   ____________________________________________   First MD Initiated Contact with Patient 04/19/19 [phone]     (approximate)  I have reviewed the triage vital signs and the nursing notes.   HISTORY  Chief Complaint Hip Pain   HPI Melvin Murray is a 53 y.o. male presents to the ED with complaint of bilateral hip pain for "a while" but states that this morning it was worse than normal.  Patient denies any known injury.  He states that his left hip this morning feels worse than the right.  He states he took ibuprofen 800 mg at approximately 4 AM and was somewhat better.  Wife states that he could still not get into his truck without increased pain.  Patient states that he walks constantly on concrete floors which adds to his hip pain.  He rates his pain as a 4 out of 10.     Past Medical History:  Diagnosis Date  . Hypercholesterolemia   . Hypertension   . Kidney stones     Patient Active Problem List   Diagnosis Date Noted  . Encounter for screening colonoscopy 09/09/2017    Past Surgical History:  Procedure Laterality Date  . COLONOSCOPY WITH PROPOFOL N/A 10/06/2017   Procedure: COLONOSCOPY WITH PROPOFOL;  Surgeon: Robert Bellow, MD;  Location: [facility] ENDOSCOPY;  Service: Endoscopy;  Laterality: N/A;  . HERNIA REPAIR    . KNEE SURGERY     right    Prior to Admission medications   Medication Sig Start Date End Date Taking? Authorizing Provider  aspirin EC 325 MG tablet Take 325 mg by mouth daily.     [provider]  bisacodyl (DULCOLAX) 5 MG EC tablet Take 1 tablet (5 mg total) by mouth as needed for moderate constipation. Take 2 tablets the morning and evening 2 days prior to your colonoscopy Take 2 tablets the afternoon prior to your colonoscopy 09/09/17   Robert Bellow, MD  lisinopril (PRINIVIL,ZESTRIL) 20 MG tablet Take 20 mg by mouth daily.  01/31/13   [provider]   meloxicam (MOBIC) 15 MG tablet Take 1 tablet (15 mg total) by mouth daily. 04/19/19 04/18/20  Johnn Hai, PA-C  Omega-3 Fatty Acids (FISH OIL) 1000 MG CAPS Take by mouth daily.    [provider]  potassium citrate (UROCIT-K) 10 MEQ (1080 MG) SR tablet Take 10 mEq by mouth 2 (two) times daily.  12/04/13   [provider]  pravastatin (PRAVACHOL) 40 MG tablet Take 40 mg by mouth daily.  02/06/13   [provider]    Allergies Patient has no known allergies.  Family History  Problem Relation Age of Onset  . Colon cancer Neg Hx     Social History Social History   Tobacco Use  . Smoking status: Never Smoker  . Smokeless tobacco: Never Used  Substance Use Topics  . Alcohol use: No    Frequency: Never  . Drug use: No    Review of Systems Constitutional: No fever/chills Cardiovascular: Denies chest pain. Respiratory: Denies shortness of breath. Gastrointestinal: No abdominal pain.  No nausea, no vomiting. Musculoskeletal: Positive bilateral hip pain. Skin: Negative for rash. Neurological: Negative for headaches, focal weakness or numbness. ____________________________________________   PHYSICAL EXAM:  VITAL SIGNS: ED Triage Vitals [04/19/19 0705]  Enc Vitals Group     BP (!) 138/49     Pulse Rate 62     Resp 18     Temp 97.7  F (36.5 C)     Temp Source Oral     SpO2 97 %     Weight 262 lb (118.8 kg)     Height 5\' 9"  (1.753 m)     Head Circumference      Peak Flow      Pain Score 4     Pain Loc      Pain Edu?      Excl. in [place]?    Constitutional: Alert and oriented. Well appearing and in no acute distress. Eyes: Conjunctivae are normal.  Head: Atraumatic. Neck: No stridor.   Cardiovascular: Normal rate, regular rhythm. Grossly normal heart sounds.  Good peripheral circulation. Respiratory: Normal respiratory effort.  No retractions. Lungs CTAB. Musculoskeletal: There is diffuse tenderness on palpation of the hips bilaterally but  patient is ambulating without any assistance.  No crepitus is appreciated.  Skin is warm and dry.  No discoloration is noted. Neurologic:  Normal speech and language. No gross focal neurologic deficits are appreciated. No gait instability. Skin:  Skin is warm, dry and intact. No rash noted. Psychiatric: Mood and affect are normal. Speech and behavior are normal.  ____________________________________________   LABS (all labs ordered are listed, but only abnormal results are displayed)  Labs Reviewed - No data to display  RADIOLOGY  ED MD interpretation:   Left hip x-ray and pelvis is negative for any acute bony changes.  Official radiology report(s): Dg Hip Unilat W Or Wo Pelvis 2-3 Views Left  Result Date: 04/19/2019 CLINICAL DATA:  Pain EXAM: DG HIP (WITH OR WITHOUT PELVIS) 2-3V LEFT COMPARISON:  None. FINDINGS: Frontal pelvis as well as frontal and lateral left hip joint images obtained. There is no evident fracture or dislocation. There is mild symmetric narrowing of each hip joint. Sacroiliac joints appear unremarkable bilaterally. No erosive changes. IMPRESSION: Slight symmetric narrowing of each hip joint. No fracture or dislocation. Electronically Signed   By: Lowella Grip III M.D.   On: 04/19/2019 08:00    ____________________________________________   PROCEDURES  Procedure(s) performed (including Critical Care):  Procedures ____________________________________________   INITIAL IMPRESSION / ASSESSMENT AND PLAN / ED COURSE  As part of my medical decision making, I reviewed the following data within the electronic MEDICAL RECORD NUMBER Notes from prior ED visits and [place] Controlled Substance Database  53 year old male presents to the ED with complaint of left hip pain this morning.  Patient states that he has had problems with his hips "for a while".  This morning he states that both of them hurt but his left one hurts worse than his right.  Patient continues to ambulate  but had difficulty getting into his truck to go to work.  He woke at 4 AM and took ibuprofen which he states has helped.  He denies any GI upset.  X-rays were discussed with patient and wife.  It was decided that we would put him on an anti-inflammatory that he only had to take once a day.  He is to follow-up with his PCP if any continued problems.  A prescription for meloxicam 1 daily was sent to his pharmacy.  ____________________________________________   FINAL CLINICAL IMPRESSION(S) / ED DIAGNOSES  Final diagnoses:  Bilateral hip pain     ED Discharge Orders         Ordered    meloxicam (MOBIC) 15 MG tablet  Daily     04/19/19 0810           Note:  This document was prepared  using Systems analyst and may include unintentional dictation errors.    Johnn Hai, PA-C 04/19/19 1211    Carrie Mew, MD 04/19/19 [phone]

## 2019-04-19 NOTE — ED Notes (Signed)
Patient ambulatory to lobby with steady gait and NAD noted. Verbalized understanding of discharge instructions, prescriptions and follow-up care.

## 2019-04-24 DIAGNOSIS — Z6836 Body mass index (BMI) 36.0-36.9, adult: Secondary | ICD-10-CM | POA: Diagnosis not present

## 2019-04-24 DIAGNOSIS — M545 Low back pain: Secondary | ICD-10-CM | POA: Diagnosis not present

## 2019-04-24 DIAGNOSIS — M160 Bilateral primary osteoarthritis of hip: Secondary | ICD-10-CM | POA: Diagnosis not present

## 2019-09-29 DIAGNOSIS — Z13220 Encounter for screening for lipoid disorders: Secondary | ICD-10-CM | POA: Diagnosis not present

## 2019-09-29 DIAGNOSIS — Z0000 Encounter for general adult medical examination without abnormal findings: Secondary | ICD-10-CM | POA: Diagnosis not present

## 2019-09-29 DIAGNOSIS — I10 Essential (primary) hypertension: Secondary | ICD-10-CM | POA: Diagnosis not present

## 2019-09-29 DIAGNOSIS — R7303 Prediabetes: Secondary | ICD-10-CM | POA: Diagnosis not present

## 2019-09-29 DIAGNOSIS — E782 Mixed hyperlipidemia: Secondary | ICD-10-CM | POA: Diagnosis not present

## 2019-09-29 DIAGNOSIS — Z1331 Encounter for screening for depression: Secondary | ICD-10-CM | POA: Diagnosis not present

## 2019-12-28 DIAGNOSIS — S0501XA Injury of conjunctiva and corneal abrasion without foreign body, right eye, initial encounter: Secondary | ICD-10-CM | POA: Diagnosis not present

## 2020-04-11 IMAGING — CR DG HIP (WITH OR WITHOUT PELVIS) 2-3V*L*
1 series · 3 of 3 positions shown · non-contrast
Comparison: None.

CLINICAL DATA: Pain

EXAM:
DG HIP (WITH OR WITHOUT PELVIS) 2-3V LEFT

[Series 1: dg hip unilat w or w/o pelvis 2-3 views  · non-contrast · 0.14mm/px · 3 of 3 slices shown]
[im 1/3]
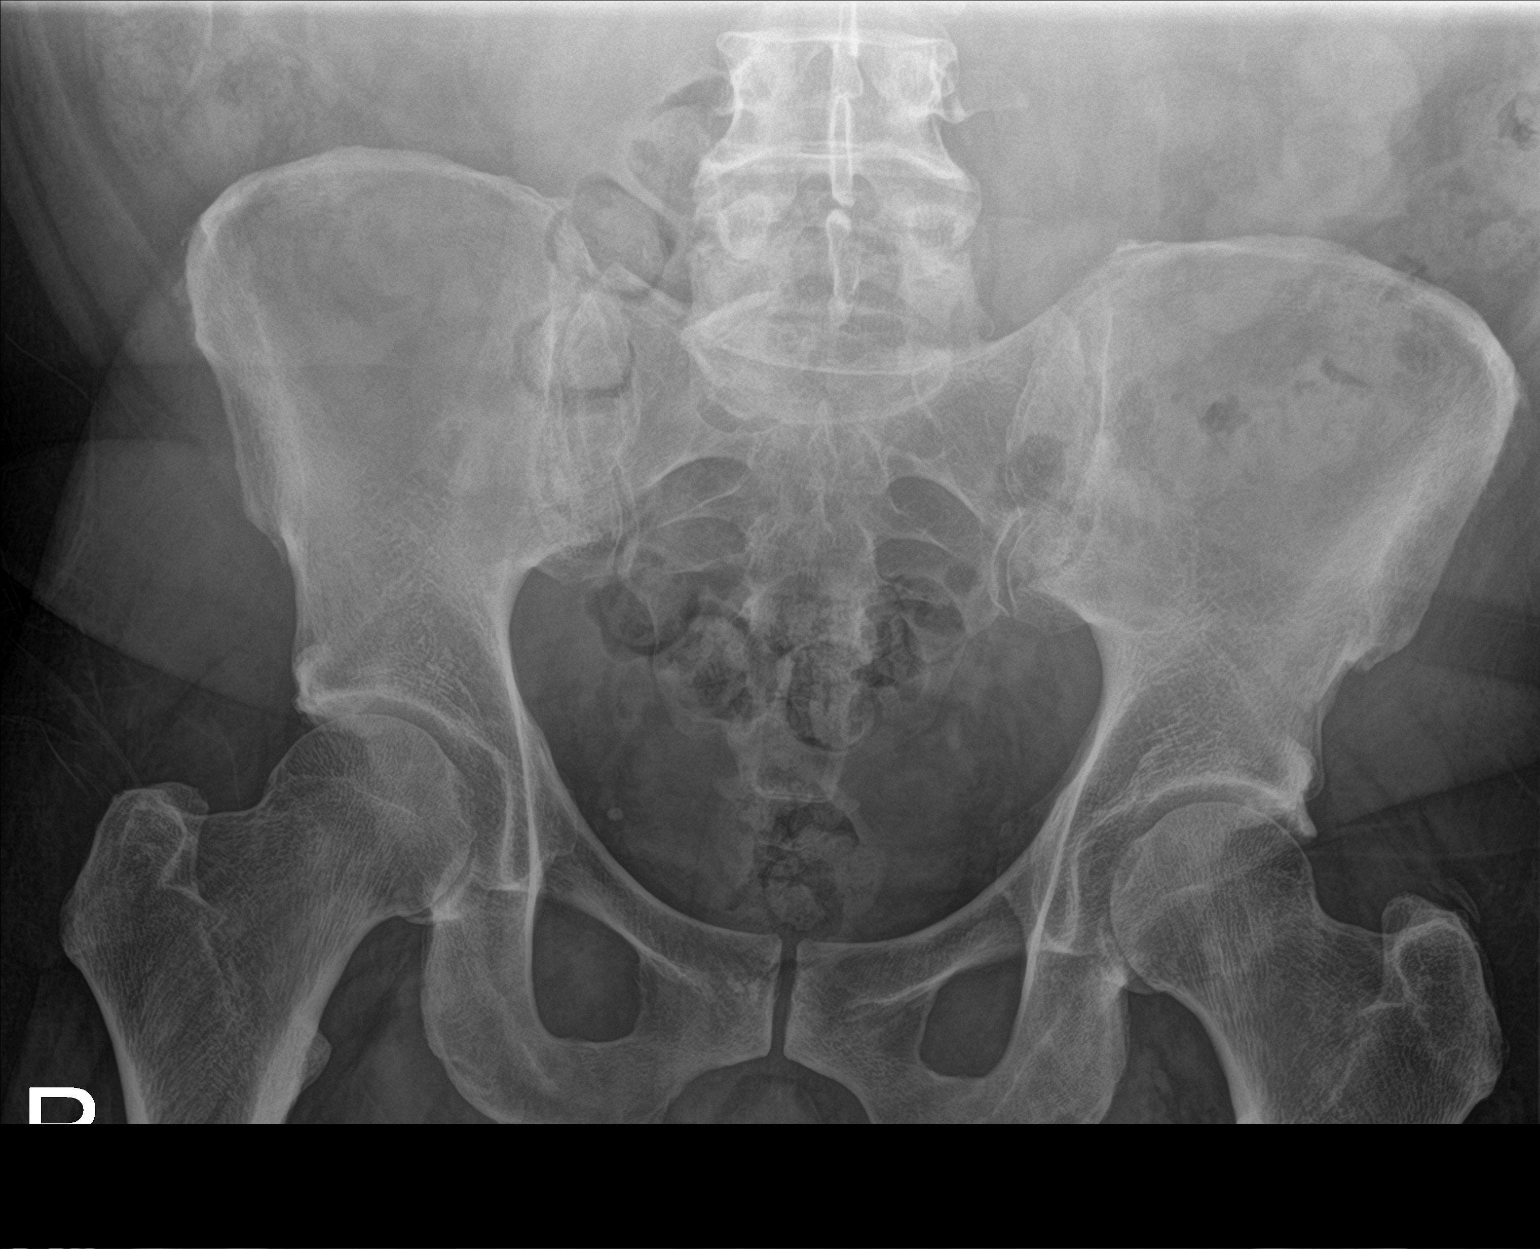
[im 2/3]
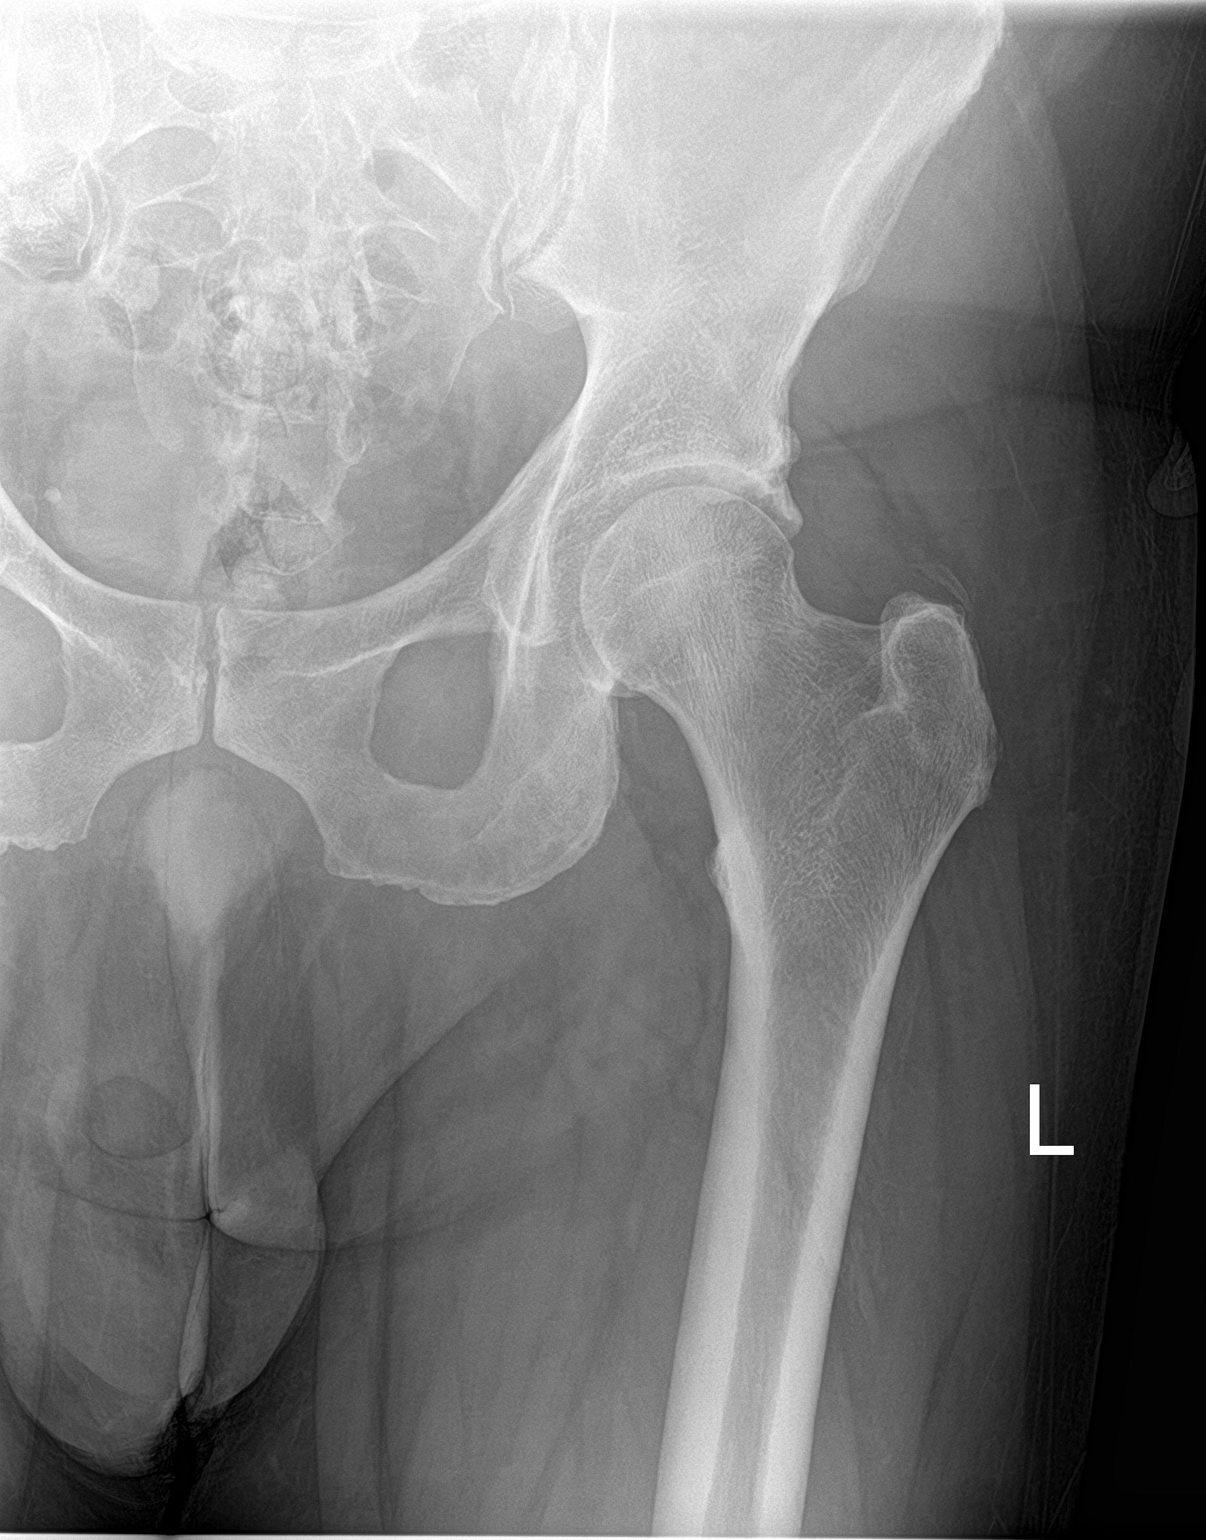
[im 3/3]
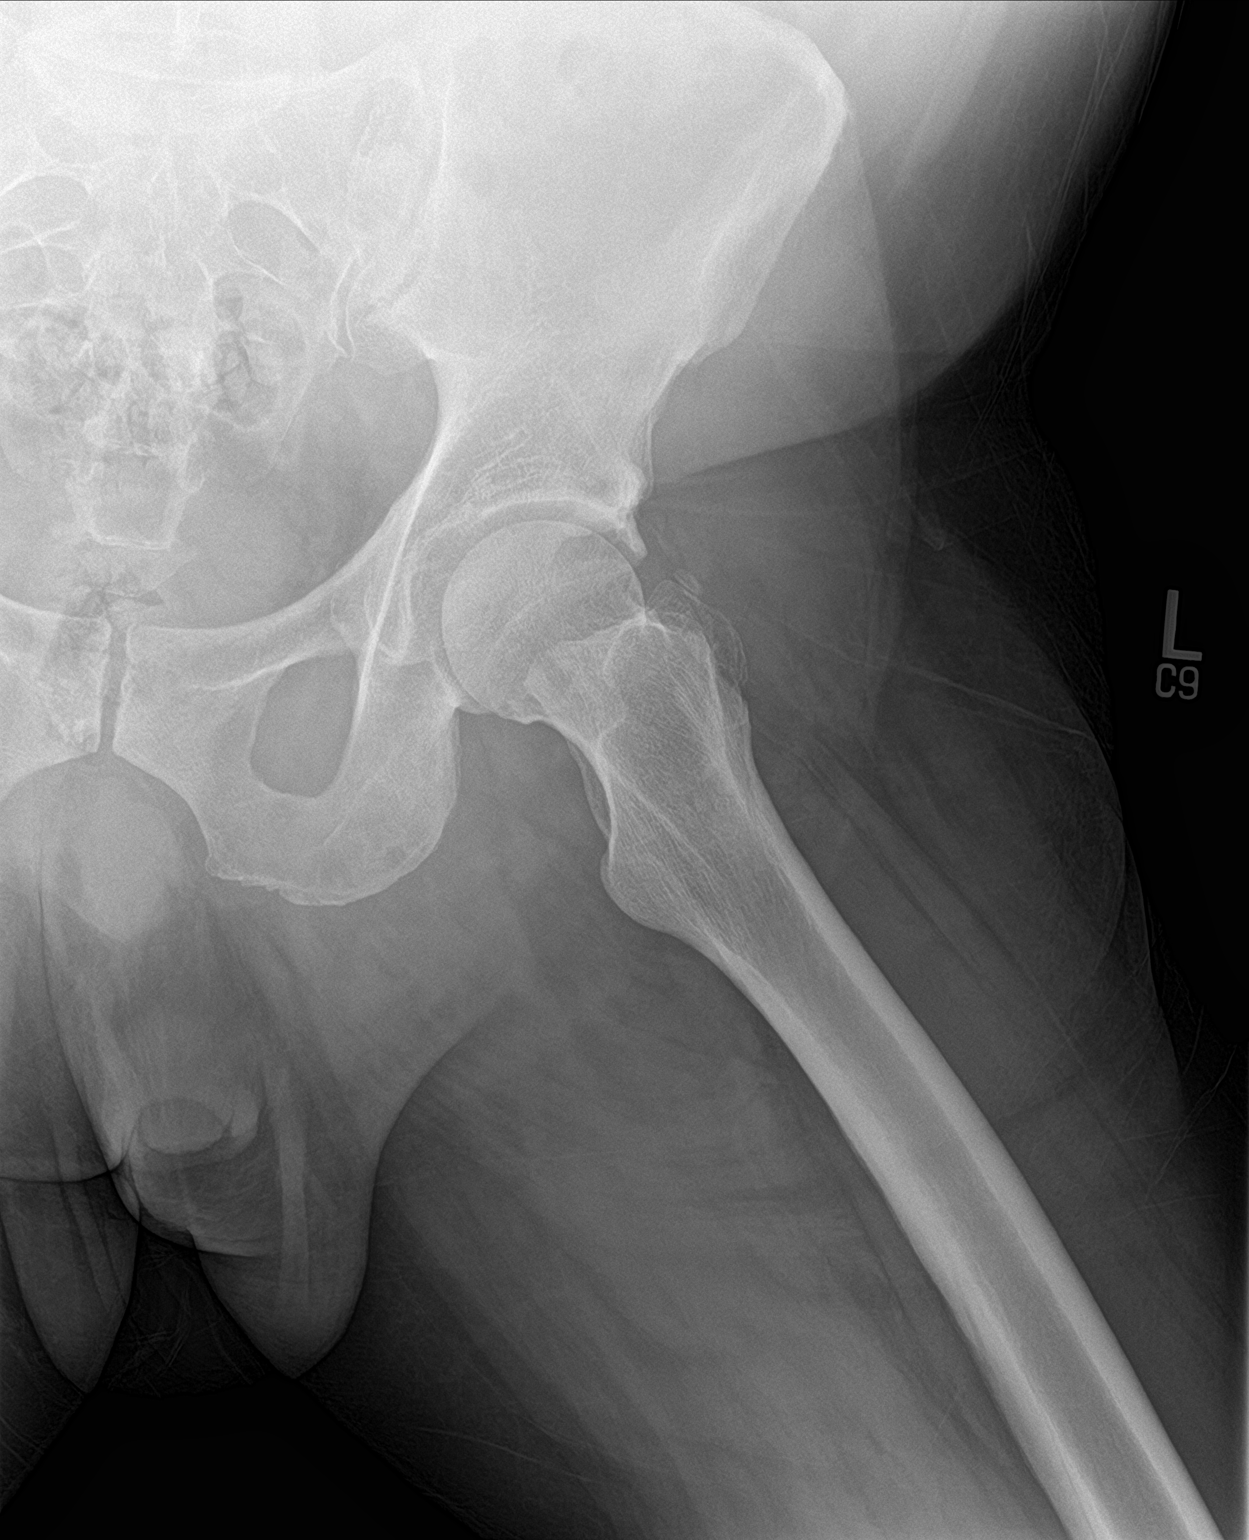

[3 of 3 positions shown; findings below may reference images not displayed]

FINDINGS: Frontal pelvis as well as frontal and lateral left hip joint images
obtained. There is no evident fracture or dislocation. There is mild
symmetric narrowing of each hip joint. Sacroiliac joints appear
unremarkable bilaterally. No erosive changes.
IMPRESSION: Slight symmetric narrowing of each hip joint. No fracture or
dislocation.

## 2020-04-24 DIAGNOSIS — I10 Essential (primary) hypertension: Secondary | ICD-10-CM | POA: Diagnosis not present

## 2020-04-24 DIAGNOSIS — N200 Calculus of kidney: Secondary | ICD-10-CM | POA: Diagnosis not present

## 2020-04-24 DIAGNOSIS — R7303 Prediabetes: Secondary | ICD-10-CM | POA: Diagnosis not present

## 2020-04-24 DIAGNOSIS — E782 Mixed hyperlipidemia: Secondary | ICD-10-CM | POA: Diagnosis not present

## 2020-10-24 DIAGNOSIS — Z1331 Encounter for screening for depression: Secondary | ICD-10-CM | POA: Diagnosis not present

## 2020-10-24 DIAGNOSIS — Z0000 Encounter for general adult medical examination without abnormal findings: Secondary | ICD-10-CM | POA: Diagnosis not present

## 2020-10-24 DIAGNOSIS — R7303 Prediabetes: Secondary | ICD-10-CM | POA: Diagnosis not present

## 2020-10-24 DIAGNOSIS — R5383 Other fatigue: Secondary | ICD-10-CM | POA: Diagnosis not present

## 2020-10-24 DIAGNOSIS — I10 Essential (primary) hypertension: Secondary | ICD-10-CM | POA: Diagnosis not present

## 2020-10-24 DIAGNOSIS — E782 Mixed hyperlipidemia: Secondary | ICD-10-CM | POA: Diagnosis not present

## 2020-11-21 DIAGNOSIS — G8929 Other chronic pain: Secondary | ICD-10-CM | POA: Diagnosis not present

## 2020-11-21 DIAGNOSIS — M1712 Unilateral primary osteoarthritis, left knee: Secondary | ICD-10-CM | POA: Diagnosis not present

## 2020-11-21 DIAGNOSIS — M25561 Pain in right knee: Secondary | ICD-10-CM | POA: Diagnosis not present

## 2020-11-21 DIAGNOSIS — M25562 Pain in left knee: Secondary | ICD-10-CM | POA: Diagnosis not present

## 2021-05-16 DIAGNOSIS — N200 Calculus of kidney: Secondary | ICD-10-CM | POA: Diagnosis not present

## 2021-05-16 DIAGNOSIS — E782 Mixed hyperlipidemia: Secondary | ICD-10-CM | POA: Diagnosis not present

## 2021-05-16 DIAGNOSIS — R7303 Prediabetes: Secondary | ICD-10-CM | POA: Diagnosis not present

## 2021-05-16 DIAGNOSIS — I10 Essential (primary) hypertension: Secondary | ICD-10-CM | POA: Diagnosis not present

## 2022-08-05 ENCOUNTER — Telehealth: Payer: Self-pay

## 2022-08-05 NOTE — Telephone Encounter (Signed)
Patient is due for their 5 year follow up colonoscopy. Last one done 10/06/2017 by Dr Bary Castilla. Left message to see if they would like a referral to [facility] to have this done, or if they would like to return to Dr Bary Castilla who is at [facility].

## 2022-12-24 ENCOUNTER — Encounter: Payer: Self-pay | Admitting: *Deleted

## 2024-07-05 ENCOUNTER — Emergency Department
Admission: EM | Admit: 2024-07-05 | Discharge: 2024-07-05 | Disposition: A | Discharge status: Home / Self Care | Payer: Self-pay | Attending: Emergency Medicine | Admitting: Emergency Medicine

## 2024-07-05 ENCOUNTER — Other Ambulatory Visit: Payer: Self-pay

## 2024-07-05 ENCOUNTER — Encounter: Payer: Self-pay | Admitting: Emergency Medicine

## 2024-07-05 DIAGNOSIS — R319 Hematuria, unspecified: Secondary | ICD-10-CM

## 2024-07-05 DIAGNOSIS — N390 Urinary tract infection, site not specified: Secondary | ICD-10-CM | POA: Insufficient documentation

## 2024-07-05 LAB — COMPREHENSIVE METABOLIC PANEL WITH GFR
ALT: 42 U/L (ref 0–44)
AST: 25 U/L (ref 15–41)
Albumin: 4.7 g/dL (ref 3.5–5.0)
Alkaline Phosphatase: 59 U/L (ref 38–126)
Anion gap: 19 — ABNORMAL HIGH (ref 5–15)
BUN: 17 mg/dL (ref 6–20)
CO2: 18 mmol/L — ABNORMAL LOW (ref 22–32)
Calcium: 9.5 mg/dL (ref 8.9–10.3)
Chloride: 100 mmol/L (ref 98–111)
Creatinine, Ser: 1.5 mg/dL — ABNORMAL HIGH (ref 0.61–1.24)
GFR, Estimated: 53 mL/min — ABNORMAL LOW (ref >60)
Glucose, Bld: 153 mg/dL — ABNORMAL HIGH (ref 70–99)
Potassium: 4.4 mmol/L (ref 3.5–5.1)
Sodium: 138 mmol/L (ref 135–145)
Total Bilirubin: 0.6 mg/dL (ref 0.0–1.2)
Total Protein: 7.7 g/dL (ref 6.5–8.1)

## 2024-07-05 LAB — CBC
HCT: 51 % (ref 39.0–52.0)
Hemoglobin: 17.8 g/dL — ABNORMAL HIGH (ref 13.0–17.0)
MCH: 31.5 pg (ref 26.0–34.0)
MCHC: 34.9 g/dL (ref 30.0–36.0)
MCV: 90.3 fL (ref 80.0–100.0)
Platelets: 194 K/uL (ref 150–400)
RBC: 5.65 MIL/uL (ref 4.22–5.81)
RDW: 12.2 % (ref 11.5–15.5)
WBC: 16.1 K/uL — ABNORMAL HIGH (ref 4.0–10.5)
nRBC: 0 % (ref 0.0–0.2)

## 2024-07-05 LAB — URINALYSIS, ROUTINE W REFLEX MICROSCOPIC
Bacteria, UA: NONE SEEN
RBC / HPF: >50 RBC/hpf (ref 0–5)
Squamous Epithelial / HPF: 0 /HPF (ref 0–5)
WBC, UA: >50 WBC/hpf (ref 0–5)

## 2024-07-05 MED ORDER — CEFTRIAXONE SODIUM 1 G IJ SOLR
1.0000 g | Freq: Once | INTRAMUSCULAR | Status: AC
  Administered 2024-07-05: 23:00:00 1 g via INTRAMUSCULAR
  Filled 2024-07-05: qty 10

## 2024-07-05 MED ORDER — CEPHALEXIN 500 MG PO CAPS: 500.0000 mg | ORAL_CAPSULE | Freq: Three times a day (TID) | ORAL | 0 refills | Status: AC

## 2024-07-05 NOTE — ED Triage Notes (Addendum)
 Pt arrives POV ambulatory to triage, gait steady, no acute distress noted c/o hematuria and painful urination that is frequent w/ little output that started today. Denies past urinary issues.

## 2024-07-05 NOTE — ED Provider Notes (Signed)
 [facility] Provider Note    Event Date/Time   First MD Initiated Contact with Patient 07/05/24 2137     (approximate)   History   Hematuria   HPI  Roel Douthat is a 58 y.o. male who presents to the emergency department today because concerns for hematuria.  Patient noticed it this morning.  He has not noticed any significant clots.  This has been accompanied by discomfort with urination as well as frequent urination.  He denies any bad odor to his urine.  Urination was normal yesterday.  Denies similar symptoms in the past.     Physical Exam   Triage Vital Signs: ED Triage Vitals  Encounter Vitals Group     BP 07/05/24 1946 135/85     Girls Systolic BP Percentile --      Girls Diastolic BP Percentile --      Boys Systolic BP Percentile --      Boys Diastolic BP Percentile --      Pulse Rate 07/05/24 1946 92     Resp 07/05/24 1946 17     Temp 07/05/24 1946 99.7 F (37.6 C)     Temp Source 07/05/24 1946 Oral     SpO2 07/05/24 1946 100 %     Weight --      Height --      Head Circumference --      Peak Flow --      Pain Score 07/05/24 2014 1     Pain Loc --      Pain Education --      Exclude from Growth Chart --     Most recent vital signs: Vitals:   07/05/24 1946  BP: 135/85  Pulse: 92  Resp: 17  Temp: 99.7 F (37.6 C)  SpO2: 100%   General: Awake, alert, oriented. CV:  Good peripheral perfusion. Regular rate and rhythm. Resp:  Normal effort. Lungs clear. Abd:  No distention. Non tender.  ED Results / Procedures / Treatments   Labs (all labs ordered are listed, but only abnormal results are displayed) Labs Reviewed  URINALYSIS, ROUTINE W REFLEX MICROSCOPIC - Abnormal; Notable for the following components:      Result Value   Color, Urine RED (*)    APPearance TURBID (*)    Glucose, UA   (*)    Value: TEST NOT REPORTED DUE TO COLOR INTERFERENCE OF URINE PIGMENT   Hgb urine dipstick   (*)    Value: TEST NOT REPORTED DUE  TO COLOR INTERFERENCE OF URINE PIGMENT   Bilirubin Urine   (*)    Value: TEST NOT REPORTED DUE TO COLOR INTERFERENCE OF URINE PIGMENT   Ketones, ur   (*)    Value: TEST NOT REPORTED DUE TO COLOR INTERFERENCE OF URINE PIGMENT   Protein, ur   (*)    Value: TEST NOT REPORTED DUE TO COLOR INTERFERENCE OF URINE PIGMENT   Nitrite   (*)    Value: TEST NOT REPORTED DUE TO COLOR INTERFERENCE OF URINE PIGMENT   Leukocytes,Ua   (*)    Value: TEST NOT REPORTED DUE TO COLOR INTERFERENCE OF URINE PIGMENT   All other components within normal limits  CBC - Abnormal; Notable for the following components:   WBC 16.1 (*)    Hemoglobin 17.8 (*)    All other components within normal limits  COMPREHENSIVE METABOLIC PANEL WITH GFR - Abnormal; Notable for the following components:   CO2 18 (*)    Glucose,  Bld 153 (*)    Creatinine, Ser 1.50 (*)    GFR, Estimated 53 (*)    Anion gap 19 (*)    All other components within normal limits     EKG  None   RADIOLOGY None  PROCEDURES:  Critical Care performed: No    MEDICATIONS ORDERED IN ED: Medications - No data to display   IMPRESSION / MDM / ASSESSMENT AND PLAN / ED COURSE  I reviewed the triage vital signs and the nursing notes.                              Differential diagnosis includes, but is not limited to, neoplasm, UTI, stone  Patient's presentation is most consistent with acute presentation with potential threat to life or bodily function.   Patient presented to the emergency department today because concerns for hematuria.  Accompanied by increased frequency and discomfort.  UA does show large amount of blood as well as some white blood cells.  Blood work with leukocytosis.  At this time I do think UTI likely the cause of the hematuria.  I discussed this with the patient.  Will start patient on antibiotics.  Did however encourage patient to follow-up after treatment to ensure resolution of blood.     FINAL CLINICAL  IMPRESSION(S) / ED DIAGNOSES   Final diagnoses:  Hematuria, unspecified type  Lower urinary tract infectious disease        Rx / DC Orders   ED Discharge Orders     None        Note:  This document was prepared using Dragon voice recognition software and may include unintentional dictation errors.    Floy Roberts, MD 07/05/24 [phone]
